# Patient Record
Sex: Male | Born: 1980 | Race: White | Hispanic: Yes | Marital: Single | Smoking: Never smoker
Health system: Southern US, Community
[De-identification: ages and names within clinical notes are randomized; demographics above are authoritative.]

## PROBLEM LIST (undated history)

## (undated) DIAGNOSIS — B009 Herpesviral infection, unspecified: Secondary | ICD-10-CM

---

## 2018-10-13 ENCOUNTER — Other Ambulatory Visit: Payer: Self-pay

## 2018-10-13 ENCOUNTER — Encounter (HOSPITAL_COMMUNITY): Payer: Self-pay

## 2018-10-13 ENCOUNTER — Emergency Department (HOSPITAL_COMMUNITY)
Admission: EM | Admit: 2018-10-13 | Discharge: 2018-10-13 | Disposition: A | Discharge status: Home / Self Care | Payer: Self-pay | Attending: Emergency Medicine | Admitting: Emergency Medicine

## 2018-10-13 ENCOUNTER — Emergency Department (HOSPITAL_COMMUNITY): Payer: Self-pay

## 2018-10-13 DIAGNOSIS — R0789 Other chest pain: Secondary | ICD-10-CM | POA: Insufficient documentation

## 2018-10-13 DIAGNOSIS — R0602 Shortness of breath: Secondary | ICD-10-CM

## 2018-10-13 HISTORY — DX: Herpesviral infection, unspecified: B00.9

## 2018-10-13 LAB — BASIC METABOLIC PANEL
ANION GAP: 7 (ref 5–15)
BUN: 12 mg/dL (ref 6–20)
CO2: 27 mmol/L (ref 22–32)
Calcium: 9.6 mg/dL (ref 8.9–10.3)
Chloride: 105 mmol/L (ref 98–111)
Creatinine, Ser: 0.98 mg/dL (ref 0.61–1.24)
GFR calc Af Amer: >60 mL/min (ref >60)
GFR calc non Af Amer: >60 mL/min (ref >60)
Glucose, Bld: 105 mg/dL — ABNORMAL HIGH (ref 70–99)
Potassium: 4.1 mmol/L (ref 3.5–5.1)
Sodium: 139 mmol/L (ref 135–145)

## 2018-10-13 LAB — POCT I-STAT TROPONIN I: Troponin i, poc: 0 ng/mL (ref 0.00–0.08)

## 2018-10-13 LAB — CBC
HCT: 46.4 % (ref 39.0–52.0)
Hemoglobin: 15.8 g/dL (ref 13.0–17.0)
MCH: 31.5 pg (ref 26.0–34.0)
MCHC: 34.1 g/dL (ref 30.0–36.0)
MCV: 92.6 fL (ref 80.0–100.0)
Platelets: 242 10*3/uL (ref 150–400)
RBC: 5.01 MIL/uL (ref 4.22–5.81)
RDW: 12.1 % (ref 11.5–15.5)
WBC: 6.2 10*3/uL (ref 4.0–10.5)
nRBC: 0 % (ref 0.0–0.2)

## 2018-10-13 LAB — D-DIMER, QUANTITATIVE: D-Dimer, Quant: <0.27 ug/mL-FEU (ref 0.00–0.50)

## 2018-10-13 MED ORDER — SODIUM CHLORIDE 0.9% FLUSH: 3.0000 mL | Freq: Once | INTRAVENOUS | Status: DC

## 2018-10-13 MED ORDER — ALBUTEROL SULFATE HFA 108 (90 BASE) MCG/ACT IN AERS
2.0000 | INHALATION_SPRAY | Freq: Once | RESPIRATORY_TRACT | Status: AC
  Administered 2018-10-13: 2 via RESPIRATORY_TRACT
  Filled 2018-10-13: qty 6.7

## 2018-10-13 NOTE — ED Notes (Signed)
ED Provider at bedside. 

## 2018-10-13 NOTE — ED Triage Notes (Signed)
Patient c/o SOB and feels like he has difficulty taking a deep breth x 2-3 weeks. Patient states the chest pain and SOB is worse when he lies down. Patient states he works with [employer] and breathes in a lot of dust. Patient is not currently having chest pain, SOB, or cough. Patient also states that he has a lot of muscle pain at times depending on the size of granite, etc.

## 2018-10-13 NOTE — ED Provider Notes (Signed)
[facility]-EMERGENCY DEPT Provider Note   CSN: [id number] Arrival date & time: 10/13/18  1120     History   Chief Complaint Chief Complaint  Patient presents with  . Shortness of Breath  . Chest Pain    HPI Jordan Delgado is a 38 y.o. male who presents with intermittent shortness of breath and chest tightness for the past year that has worsened over the last 2 weeks. He describes it as difficult to take a full breath. He denies cough or wheezing. His chest tightness is substernal and does not radiate anywhere.  Patient works with [employer] and breathes in a lot of dust despite wearing a mask. Does note that this worsens his symptoms. No fevers, chills, lightheadedness, n/v, reflux symptoms, abdominal pain, recent travel, lower extremity swelling. He is a non-smoker and does not take any medications.    Past Medical History:  Diagnosis Date  . Herpes     There are no active problems to display for this patient.   History reviewed. No pertinent surgical history.      Home Medications    Prior to Admission medications   Not on File    Family History Family History  Problem Relation Age of Onset  . Hypertension Mother   . Diabetes Father     Social History Social History   Tobacco Use  . Smoking status: Never Smoker  . Smokeless tobacco: Never Used  Substance Use Topics  . Alcohol use: Yes    Comment: occasionally  . Drug use: Never     Allergies   Patient has no known allergies.   Review of Systems Review of Systems  All other systems reviewed and are negative.    Physical Exam Updated Vital Signs BP 106/76   Pulse 74   Temp 99.2 F (37.3 C) (Oral)   Resp 17   Ht 5\' 9"  (1.753 m)   Wt 67.1 kg   SpO2 100%   BMI 21.86 kg/m   Physical Exam Vitals signs reviewed.  Constitutional:      General: He is not in acute distress.    Appearance: He is well-developed.  HENT:     Head: Normocephalic and atraumatic.   Mouth/Throat:     Mouth: Mucous membranes are moist.     Pharynx: Oropharynx is clear.  Neck:     Musculoskeletal: Normal range of motion.  Cardiovascular:     Rate and Rhythm: Normal rate and regular rhythm.     Heart sounds: Normal heart sounds.  Pulmonary:     Effort: Pulmonary effort is normal.     Breath sounds: Normal breath sounds. No wheezing, rhonchi or rales.  Lymphadenopathy:     Cervical: No cervical adenopathy.  Neurological:     Mental Status: He is alert.      ED Treatments / Results  Labs (all labs ordered are listed, but only abnormal results are displayed) Labs Reviewed  BASIC METABOLIC PANEL - Abnormal; Notable for the following components:      Result Value   Glucose, Bld 105 (*)    All other components within normal limits  CBC  D-DIMER, QUANTITATIVE (NOT AT [facility])  I-STAT TROPONIN, ED  POCT I-STAT TROPONIN I    EKG EKG Interpretation  Date/Time:  Wednesday October 13 2018 16:36:11 EST Ventricular Rate:  83 PR Interval:    QRS Duration: 109 QT Interval:  381 QTC Calculation: 448 R Axis:   69 Text Interpretation:  Sinus rhythm RSR' in  V1 or V2, right VCD or RVH Minimal ST elevation, anterior leads No previous ECGs available Confirmed by Alvira Monday ([id number]) on 10/13/2018 5:11:23 PM   Radiology Dg Chest 2 View  Result Date: 10/13/2018 CLINICAL DATA:  Chest pain and shortness of breath. EXAM: CHEST - 2 VIEW COMPARISON:  Report of chest x-ray dated 09/14/2013 FINDINGS: Heart size and vascularity are normal. Lungs are clear except for a tiny chronic calcified granuloma at the left lung base laterally as described on the prior study. No effusions. No bone abnormality. IMPRESSION: No active cardiopulmonary disease. Electronically Signed   By: Francene Boyers M.D.   On: 10/13/2018 13:14    Procedures Procedures (including critical care time)  Medications Ordered in ED Medications  sodium chloride flush (NS) 0.9 % injection 3 mL (0 mLs  Intravenous Hold 10/13/18 1628)  albuterol (PROVENTIL HFA;VENTOLIN HFA) 108 (90 Base) MCG/ACT inhaler 2 puff (2 puffs Inhalation Given 10/13/18 1828)     Initial Impression / Assessment and Plan / ED Course  I have reviewed the triage vital signs and the nursing notes.  Pertinent labs & imaging results that were available during my care of the patient were reviewed by me and considered in my medical decision making (see chart for details).   38 y/o otherwise healthy gentleman presenting with 12 month history of intermittent shortness of breath and chest tightness that has worsened over the last 2 weeks.  Patient's Heart score is 1. Istat troponin is negative, and he has no ischemic changes on EKG. He is low-risk [place]. D-dimer was negative. CXR does not show any acute process.  Symptoms most consistent with reactive airway disease. Will administer albuterol here and refer him to establish care with PCP who can make pulmonology referral if further work-up is indicated.    Final Clinical Impressions(s) / ED Diagnoses   Final diagnoses:  Shortness of breath  Chest tightness   Patient's work-up in the ED negative for acute cardiac ischemia, PE. CXR did not reveal any acute cardiopulmonary process. He is medically stable for discharge. Symptoms most consistent with reactive airway disease. Given Albuterol inhaler to use as needed. He was encouraged to establish care with PCP and may benefit from PFTs as an outpatient.   ED Discharge Orders    None       Bridget Hartshorn, DO 10/13/18 2001    Alvira Monday, MD 10/16/18 [phone]

## 2020-04-22 IMAGING — CR DG CHEST 2V
2 series · 2 of 2 positions shown · non-contrast
Comparison: Report of chest x-ray dated 09/14/2013

CLINICAL DATA: Chest pain and shortness of breath.

EXAM:
CHEST - 2 VIEW

[w chest pa]
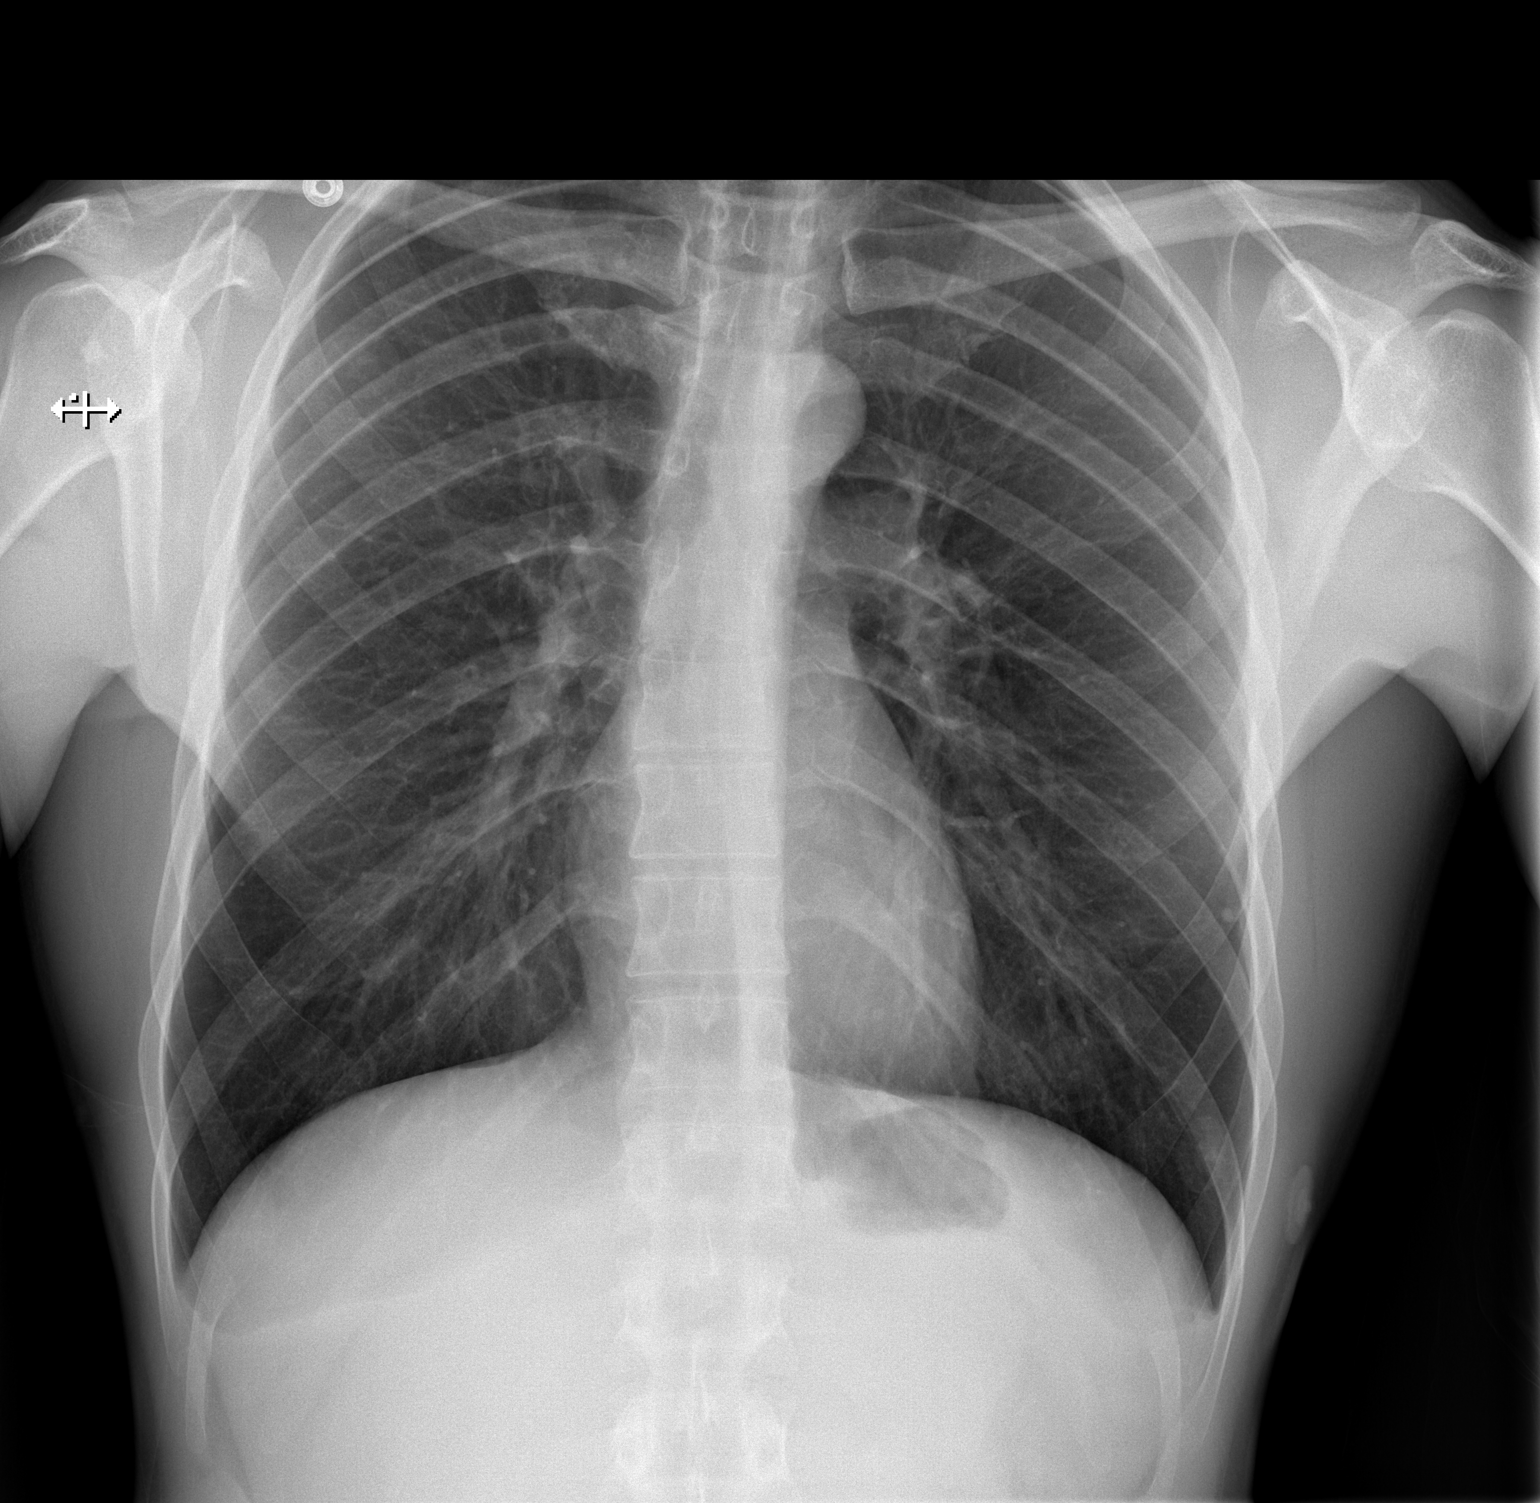

[w chest lat]
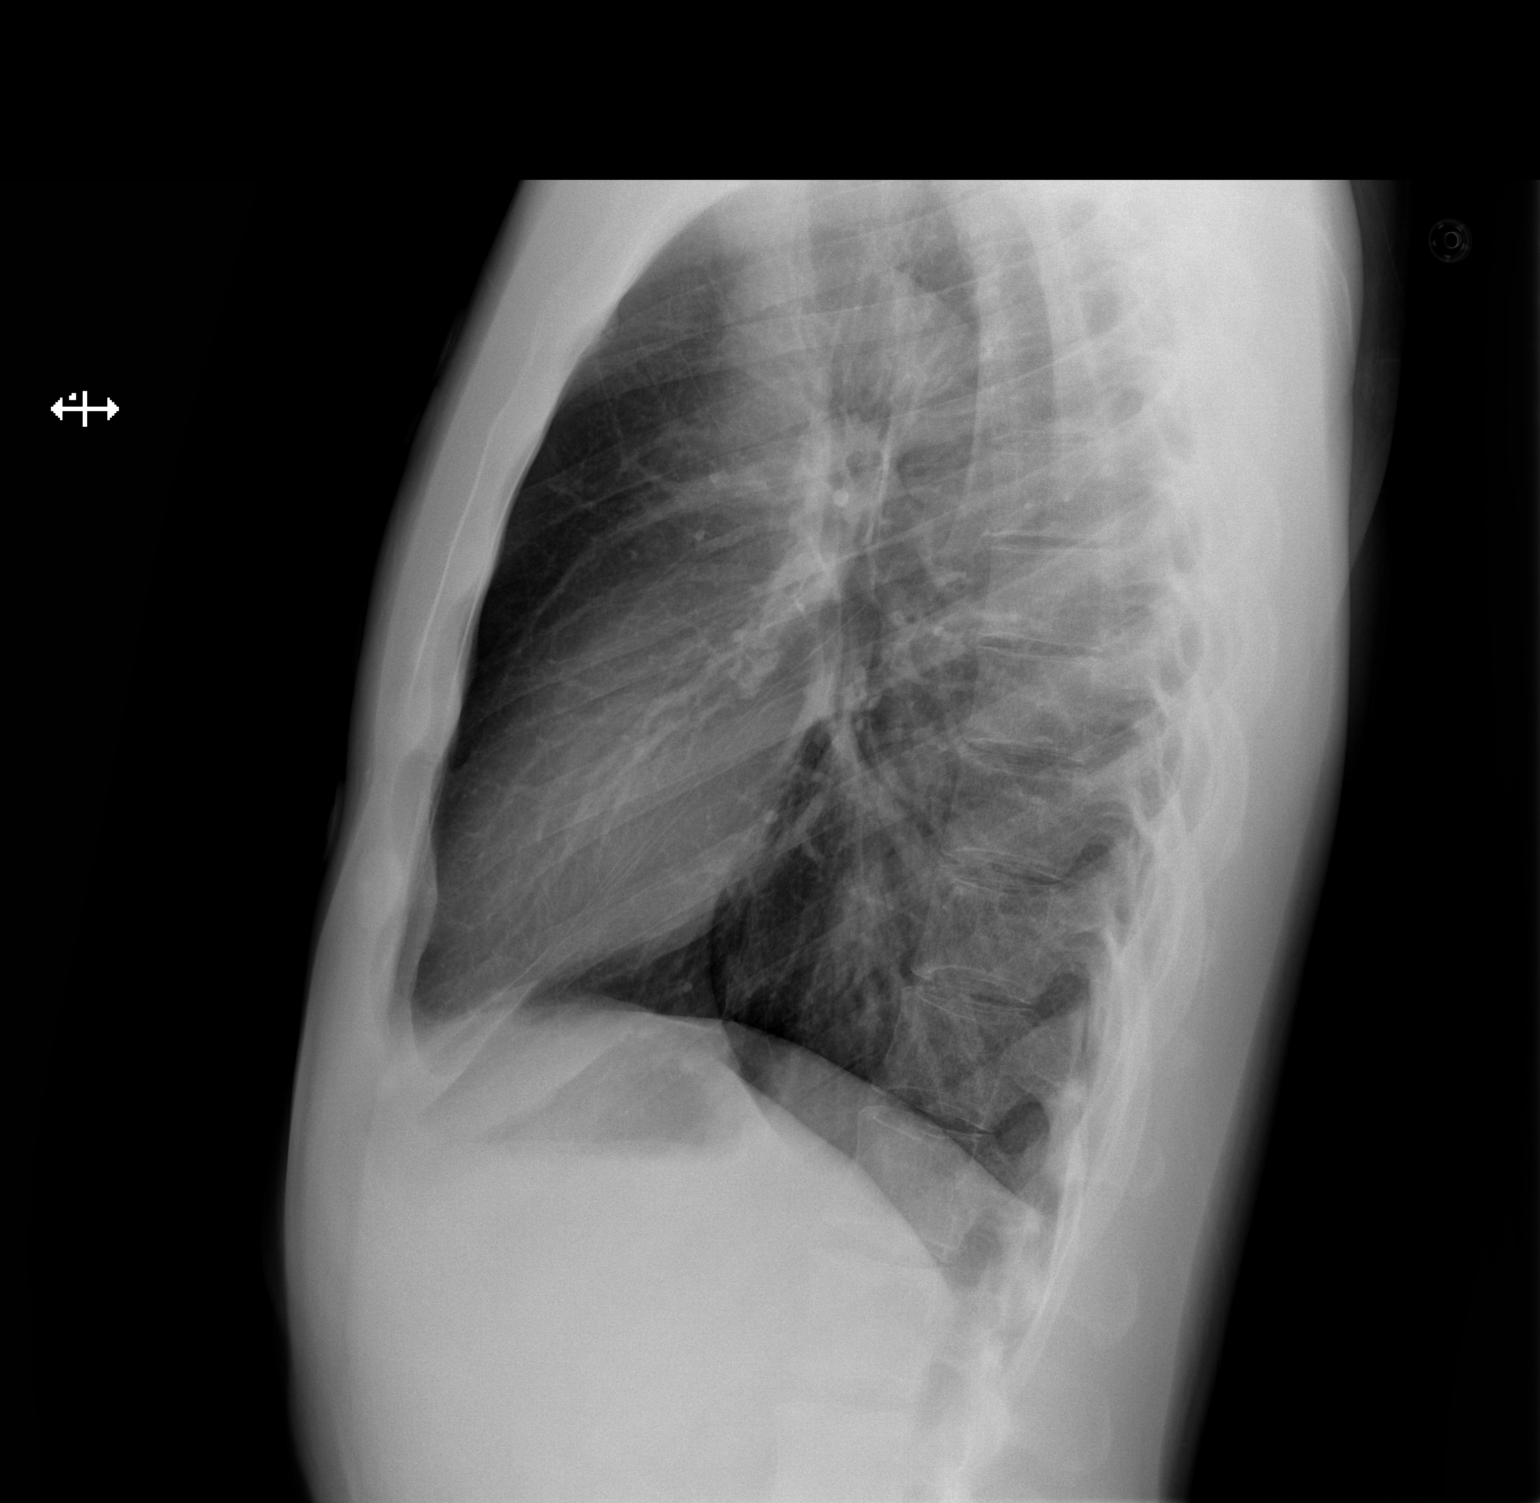

[2 of 2 positions shown; findings below may reference images not displayed]

FINDINGS: Heart size and vascularity are normal. Lungs are clear except for a
tiny chronic calcified granuloma at the left lung base laterally as
described on the prior study. No effusions. No bone abnormality.
IMPRESSION: No active cardiopulmonary disease.

## 2021-10-09 ENCOUNTER — Encounter (HOSPITAL_BASED_OUTPATIENT_CLINIC_OR_DEPARTMENT_OTHER): Payer: Self-pay

## 2021-10-09 ENCOUNTER — Other Ambulatory Visit: Payer: Self-pay

## 2021-10-09 ENCOUNTER — Emergency Department (HOSPITAL_BASED_OUTPATIENT_CLINIC_OR_DEPARTMENT_OTHER)
Admission: EM | Admit: 2021-10-09 | Discharge: 2021-10-09 | Disposition: A | Discharge status: Home / Self Care | Payer: Self-pay | Attending: Emergency Medicine | Admitting: Emergency Medicine

## 2021-10-09 DIAGNOSIS — R5383 Other fatigue: Secondary | ICD-10-CM | POA: Insufficient documentation

## 2021-10-09 LAB — URINALYSIS, ROUTINE W REFLEX MICROSCOPIC
Bilirubin Urine: NEGATIVE
Glucose, UA: NEGATIVE mg/dL
Hgb urine dipstick: NEGATIVE
Ketones, ur: NEGATIVE mg/dL
Leukocytes,Ua: NEGATIVE
Nitrite: NEGATIVE
Protein, ur: NEGATIVE mg/dL
Specific Gravity, Urine: 1.02 (ref 1.005–1.030)
pH: 8.5 — ABNORMAL HIGH (ref 5.0–8.0)

## 2021-10-09 LAB — CBC
HCT: 45.7 % (ref 39.0–52.0)
Hemoglobin: 16.2 g/dL (ref 13.0–17.0)
MCH: 32 pg (ref 26.0–34.0)
MCHC: 35.4 g/dL (ref 30.0–36.0)
MCV: 90.1 fL (ref 80.0–100.0)
Platelets: 248 10*3/uL (ref 150–400)
RBC: 5.07 MIL/uL (ref 4.22–5.81)
RDW: 12 % (ref 11.5–15.5)
WBC: 6.4 10*3/uL (ref 4.0–10.5)
nRBC: 0 % (ref 0.0–0.2)

## 2021-10-09 LAB — BASIC METABOLIC PANEL
Anion gap: 7 (ref 5–15)
BUN: 9 mg/dL (ref 6–20)
CO2: 29 mmol/L (ref 22–32)
Calcium: 9.1 mg/dL (ref 8.9–10.3)
Chloride: 100 mmol/L (ref 98–111)
Creatinine, Ser: 1.19 mg/dL (ref 0.61–1.24)
GFR, Estimated: >60 mL/min (ref >60)
Glucose, Bld: 124 mg/dL — ABNORMAL HIGH (ref 70–99)
Potassium: 4.3 mmol/L (ref 3.5–5.1)
Sodium: 136 mmol/L (ref 135–145)

## 2021-10-09 LAB — TROPONIN I (HIGH SENSITIVITY): Troponin I (High Sensitivity): <2 ng/L (ref <18)

## 2021-10-09 NOTE — Discharge Instructions (Addendum)
Please refer to the attached instructions. Your labs obtained today are reassuring. The TSH test is pending. You will be notified if it is abnormal or needs to be addressed further. Maintain a good diet, hydration, and get plenty of sleep.

## 2021-10-09 NOTE — ED Provider Notes (Signed)
[facility] EMERGENCY DEPARTMENT Provider Note   CSN: [id number] Arrival date & time: 10/09/21  1529     History  Chief Complaint  Patient presents with   Fatigue    Jordan Delgado is a 41 y.o. male.  Patient with complaint of intermittent fatigue. No fever or chills. No abdominal pain, nausea, vomiting, diarrhea. No muscle weakness. Occasional, week-end alcohol user. Denies drug abuse. No dark or tarry stools. Family history of diabetes. Patient denies polyuria, polydipsia, polyphagia. Mild sinus congestion.   The history is provided by the patient. No language interpreter was used.  Weakness Severity:  Mild Onset quality:  Gradual Duration:  3 days Timing:  Sporadic Progression:  Waxing and waning Chronicity:  Recurrent Context: alcohol use and dehydration   Context: not drug use and not recent infection   Ineffective treatments:  None tried Associated symptoms: no abdominal pain, no anorexia, no chest pain, no cough, no diarrhea, no dizziness, no dysuria, no fever, no headaches, no melena, no nausea and no vomiting   Risk factors: diabetes   Risk factors: no anemia       Home Medications Prior to Admission medications   Not on File      Allergies    Patient has no known allergies.    Review of Systems   Review of Systems  Constitutional:  Negative for fever.  HENT:  Positive for sinus pressure.   Respiratory:  Negative for cough.   Cardiovascular:  Negative for chest pain.  Gastrointestinal:  Negative for abdominal pain, anorexia, diarrhea, melena, nausea and vomiting.  Endocrine: Negative for polydipsia, polyphagia and polyuria.  Genitourinary:  Negative for dysuria.  Neurological:  Positive for weakness. Negative for dizziness and headaches.  All other systems reviewed and are negative.  Physical Exam Updated Vital Signs BP 111/88 (BP Location: Left Arm)    Pulse 85    Temp 99.1 F (37.3 C) (Oral)    Resp 18    Ht 5\' 8"  (1.727 m)    Wt 68.5 kg     SpO2 100%    BMI 22.96 kg/m  Physical Exam Vitals and nursing note reviewed.  Constitutional:      Appearance: Normal appearance.  HENT:     Head: Atraumatic.     Nose: Nose normal.     Mouth/Throat:     Mouth: Mucous membranes are moist.  Eyes:     Extraocular Movements: Extraocular movements intact.     Conjunctiva/sclera: Conjunctivae normal.  Cardiovascular:     Rate and Rhythm: Normal rate.  Pulmonary:     Effort: Pulmonary effort is normal.  Abdominal:     Palpations: Abdomen is soft.  Musculoskeletal:        General: Normal range of motion.     Cervical back: Normal range of motion.  Skin:    General: Skin is warm and dry.  Neurological:     Mental Status: He is alert and oriented to person, place, and time.  Psychiatric:        Mood and Affect: Mood normal.        Behavior: Behavior normal.    ED Results / Procedures / Treatments   Labs (all labs ordered are listed, but only abnormal results are displayed) Labs Reviewed  BASIC METABOLIC PANEL - Abnormal; Notable for the following components:      Result Value   Glucose, Bld 124 (*)    All other components within normal limits  URINALYSIS, ROUTINE W REFLEX MICROSCOPIC - Abnormal;  Notable for the following components:   pH 8.5 (*)    All other components within normal limits  CBC  TSH  TROPONIN I (HIGH SENSITIVITY)    EKG EKG Interpretation  Date/Time:  Wednesday October 09 2021 15:48:22 EST Ventricular Rate:  84 PR Interval:  138 QRS Duration: 100 QT Interval:  372 QTC Calculation: 439 R Axis:   80 Text Interpretation: Normal sinus rhythm Right atrial enlargement No significant change since last tracing Confirmed by Orpah Greek [phone]) on 10/09/2021 5:05:58 PM  Radiology No results found.  Procedures Procedures    Medications Ordered in ED Medications - No data to display  ED Course/ Medical Decision Making/ A&P                           Medical Decision Making Amount and/or  Complexity of Data Reviewed Labs: ordered.   Patient presents with intermittent fatigue. No indication of dehydration or AKI. Considered alternative etiologies including infectious processes, metabolic derangements, electrolyte abnormalities. No lab abnormalities. TSH drawn. ECG without ischemic changes. Care instructions and return precautions provided. Patient appears safe for discharge at this time.        Final Clinical Impression(s) / ED Diagnoses Final diagnoses:  Fatigue, unspecified type    Rx / DC Orders ED Discharge Orders     None         Etta Quill, NP 10/10/21 0018    Orpah Greek, MD 10/11/21 [phone]

## 2021-10-09 NOTE — ED Triage Notes (Addendum)
Pt c/o "feeling tiredness in my head that goes down into me chest" x 3 days-denies as pain then states "a light headache"-denies fever/flu sx-state she has had ssame sx in the past-has not sought medical treatment-NAD-steady gait

## 2021-10-09 NOTE — ED Notes (Signed)
Dc instructions reviewed with pt no questions or concerns this time. Will follow up with pcp or ENT as needed.

## 2021-10-09 NOTE — ED Notes (Addendum)
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

## 2021-10-10 LAB — TSH: TSH: 1.279 u[IU]/mL (ref 0.350–4.500)
